# Patient Record
Sex: Male | Born: 2005 | Race: White | Hispanic: No | Marital: Single | Smoking: Never smoker
Health system: Southern US, Community
[De-identification: ages and names within clinical notes are randomized; demographics above are authoritative.]

## PROBLEM LIST (undated history)

## (undated) ENCOUNTER — Emergency Department (HOSPITAL_COMMUNITY): Discharge status: Absent without leave | Payer: Self-pay

## (undated) DIAGNOSIS — T7840XA Allergy, unspecified, initial encounter: Secondary | ICD-10-CM

## (undated) DIAGNOSIS — J45909 Unspecified asthma, uncomplicated: Secondary | ICD-10-CM

## (undated) DIAGNOSIS — F909 Attention-deficit hyperactivity disorder, unspecified type: Secondary | ICD-10-CM

## (undated) DIAGNOSIS — E7800 Pure hypercholesterolemia, unspecified: Secondary | ICD-10-CM

## (undated) HISTORY — DX: Allergy, unspecified, initial encounter: T78.40XA

## (undated) HISTORY — PX: TYMPANOSTOMY TUBE PLACEMENT: SHX32

## (undated) HISTORY — DX: Attention-deficit hyperactivity disorder, unspecified type: F90.9

## (undated) HISTORY — PX: TOOTH EXTRACTION: SHX859

---
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
## 2024-03-27 ENCOUNTER — Encounter (INDEPENDENT_AMBULATORY_CARE_PROVIDER_SITE_OTHER): Payer: Self-pay | Admitting: Pediatrics

## 2024-03-27 ENCOUNTER — Ambulatory Visit (INDEPENDENT_AMBULATORY_CARE_PROVIDER_SITE_OTHER): Payer: Self-pay | Admitting: Pediatrics

## 2024-03-27 VITALS — BP 110/76 | HR 89 | Wt 210.8 lb

## 2024-03-27 DIAGNOSIS — E7849 Other hyperlipidemia: Secondary | ICD-10-CM | POA: Diagnosis not present

## 2024-03-27 DIAGNOSIS — Z713 Dietary counseling and surveillance: Secondary | ICD-10-CM | POA: Diagnosis not present

## 2024-03-27 DIAGNOSIS — Z8342 Family history of familial hypercholesterolemia: Secondary | ICD-10-CM | POA: Diagnosis not present

## 2024-03-27 DIAGNOSIS — Z7187 Encounter for pediatric-to-adult transition counseling: Secondary | ICD-10-CM

## 2024-03-27 LAB — LIPID PANEL
Cholesterol: 275 mg/dL — ABNORMAL HIGH (ref <170)
HDL: 51 mg/dL (ref >45)
LDL Cholesterol (Calc): 187 mg/dL — ABNORMAL HIGH (ref <110)
Non-HDL Cholesterol (Calc): 224 mg/dL — ABNORMAL HIGH (ref <120)
Total CHOL/HDL Ratio: 5.4 (calc) — ABNORMAL HIGH (ref <5.0)
Triglycerides: 194 mg/dL — ABNORMAL HIGH (ref <90)

## 2024-03-27 MED ORDER — ROSUVASTATIN CALCIUM 10 MG PO TABS: 10.0000 mg | ORAL_TABLET | Freq: Every day | ORAL | 3 refills | Status: AC

## 2024-03-27 NOTE — Patient Instructions (Addendum)
 Medication: continue rosuvastatin  10mg  daily  Laboratory studies:  Recommend follow up with family practice for further management.  Recommendations for healthy eating  Never skip breakfast. Try to have at least 10 grams of protein (glass of milk, eggs, shake, or breakfast bar). No soda, juice, or sweetened drinks. Limit starches/carbohydrates to 1 fist per meal at breakfast, lunch and dinner. No eating after dinner. Eat three meals per day and dinner should be with the family. Limit of one snack daily, after school. All snacks should be a fruit or vegetables without dressing. Avoid bananas/grapes. Low carb fruits: berries, green apple, cantaloupe, honeydew No breaded or fried foods. Increase water intake, drink ice cold water 8 to 10 ounces before eating. Exercise daily for 30 to 60 minutes.  For insomnia or inability to stay asleep at night: Sleep App: Insomnia Coach  Meditate: Headspace on Netflix has guided meditation or Youtube Apps: Calm or Headspace have guided meditation     Education: What is hypercholesterolemia?  Hypercholesterolemia in a child means elevated blood cholesterol levels. Hyperlipidemia is a more general term that refers to elevation of cholesterol or triglyceride level. Total cholesterol is made up of non-high-density lipoprotein (HDL) cholesterol (non-HDL-C) and HDL cholesterol (HDL-C). Non-HDL-C is made up of low-density lipoprotein (LDL) cholesterol (LDL-C) and very low-density lipoprotein (VLDL) cholesterol (VLDL-C). If triglyceride levels are equal to or greater than 400 mg/dL, the calculated  LDL-C is not accurate and a direct measurement must be ordered.  Low-density lipoprotein cholesterol is referred to as the bad cholesterol that gets deposited in blood vessel walls. High-density lipoprotein cholesterol is referred to as the good cholesterol that removes excess LDL-C from blood vessel walls. Even though elevated LDL-C and HDL-C levels can cause  hypercholesterolemia, an elevated LDL-C level traditionally carries more risk.  An acceptable LDL-C level in otherwise healthy children is less than 110 mg/dL; borderline high is 098 to 129 mg/dL; and high is 119 mg/dL or greater, according to [facility], [facility], [facility] Institute guidelines. Other cardiovascular risk factors and whether a child has diabetes should be taken into consideration when deciding whether to treat an LDL-C level above 130 mg/dL.  What causes hypercholesterolemia?  Low-density lipoprotein cholesterol is mainly made by the liver; some LDL-C comes from the diet. The blood level of LDL-C is also influenced by hereditary factors. Mild to moderate LDL-C level elevation can be the result of bad diet and lifestyle. Mild to moderate LDL-C level elevation can also be seen in obesity. In children with obesity, triglyceride levels are often elevated more than cholesterol, and HDL-C level is often low.   Familial hypercholesterolemia is a common inherited condition. Children with this condition often have an LDL-C level that is greater than 190 mg/dL. In this condition, the liver keeps making LDL-C even though the blood level of LDL-C is already high. Uncontrolled diabetes, hypothyroidism, and kidney diseases can also cause elevated LDL-C and triglyceride levels.  What problems result from having high cholesterol levels?  Extra cholesterol, especially LDL-C, is deposited in blood vessel walls, the buildup of which causes an inflammatory reaction. Over time, this can narrow the arteries, block blood flow, and cause heart attacks. Such problems usually do not occur before 19 years of age in men and 19 years of age in women. Patients with LDL-C levels above 190 mg/dL are at risk for having these problems at an earlier age. What are the measures to control high cholesterol?  Dietary cholesterol mainly comes from foods of animal origin, but  it does not play a large role in determining  cholesterol levels. Increasing physical activity in children with elevated cholesterol levels can be beneficial to improve cholesterol levels. The American Academy of Pediatrics recommends that children get at least 1 hour of moderate to vigorous activity daily. A diet restricting fat can decrease LDL-C levels by 8% to 10%.Total fat restriction is not as important as restricting saturated and trans fats while favoring healthy monounsaturated and polyunsaturated fats.  Saturated fats can increase blood cholesterol levels. Foods high in saturated fat mostly include animal products, such as meat (eg, beef, sausage, bacon, hot dogs, lunch meats like bologna, salami, and poultry with skin), and dairy products (eg, whole milk, cheese, butter, ice cream). Choose lean protein foods, like fish, lean cuts of meat, white meat without skin, and lean cuts of red meat, to reduce saturated fat. Low-fat (2%) milk can be used starting at 33 to 19 years of age in those with risk factors for hypercholesterolemia.  Fat-free or 1% milk can be used after 19 years of age. Remember to look at total fat and saturated fat contents on nutrition labels.   In children with hypercholesterolemia, saturated fat intake should be less than 7% of calories and dietary cholesterol intake should be less than 200 mg per day. Plant oils that are high in saturated fats include coconut and palm oils. Eating trans fats found in highly processed foods can also increase LDL-C levels; hence avoid trans fat as much as possible. Some examples of foods that contain trans fatty acids are microwave popcorn, frozen pizza, and coffee creamer. Cooking should be done using vegetable oils (eg, canola, corn, olive, safflower) that are high in monounsaturated and polyunsaturated fats. Try to increase your child's intake of nuts, such  as walnuts and almonds, which are another source of monounsaturated fats. Cold-water fish, especially tuna, swordfish, salmon, mackerel,  sardines, and herring, are healthy, and eating them at least 2 times a week is recommended as a source of omega-3 polyunsaturated fats.  Cholesterol is never found in plant sources. In fact, plant sterols (cholesterol equivalent of plants) can decrease LDL-C levels. Whole grains, beans, high-fiber cereals, fruits, vegetables, water-soluble fiber (eg, psyllium), soy protein, and oat bran can reduce LDL-C levels by decreasing absorption of cholesterol. It is recommended that children with obesity, elevated LDL-C levels, and elevated triglyceride levels work on weight loss and reduce their intake of sugars and starchy foods (see Hypertriglyceridemia: A Guide for Families handout).  Medications  When LDL-C levels are persistently higher than 130 to 190 mg/dL, depending on the presence or absence of specific risk factors  and/or conditions, even after dietary and lifestyle changes, your child's doctor may recommend a lipid-lowering medication, such as a statin. Thus, children with medical conditions, such as diabetes or kidney disease, may need to start cholesterol-lowering medications at lower levels of LDL-C than otherwise healthy children.   When should your child's cholesterol levels be checked?  Cholesterol screening is recommended for all children between ages 25 and 11 years and again between 49 and 21 years. If there is positive family history of heart disease and elevated cholesterol in immediate relatives, cholesterol screening is recommended earlier, between 48 and 79 years of age.   Pediatric Endocrinology Fact Sheet What is Hypercholesterolemia? A Guide for Families Copyright  2018 American Academy of Pediatrics and Pediatric Endocrine Society. All rights reserved. The information contained in this publication should not be used as a substitute for the medical care and advice of  your pediatrician. There may be variations in treatment that your pediatrician may recommend based on individual  facts and circumstances. Pediatric Endocrine Society/American Academy of Pediatrics  Section on Endocrinology Patient Education Committee

## 2024-03-27 NOTE — Progress Notes (Signed)
 Pediatric Endocrinology Consultation Follow-up Visit [place] Marchiano Mar 08, 2006 [id number] Alejandro Hurt, FNP   HPI: Darrell Flores  is a 18 y.o. male presenting for follow-up of Hypercholesterolemia.  he is accompanied to this visit by his father and family. Interpreter present throughout the visit: No.  Kincade was last seen at [facility] on 05/30/2023.  Since last visit, he has been taking rosuvastatin , 1 tablet daily with no side effects.  ROS: Greater than 10 systems reviewed with pertinent positives listed in HPI, otherwise neg. The following portions of the patient's history were reviewed and updated as appropriate:  Past Medical History:  has a past medical history of ADHD (attention deficit hyperactivity disorder), Allergy, Asthma, and High cholesterol.  Meds: Current Outpatient Medications  Medication Instructions   albuterol  (VENTOLIN ) (5 MG/ML) 0.5% NEBU As needed   EPINEPHrine  0.3 mg/0.3 mL IJ SOAJ injection Inject into the muscle.   methylphenidate (CONCERTA) 27 mg, Daily at bedtime   rosuvastatin  (CRESTOR ) 10 mg, Oral, Daily    Allergies: Allergies  Allergen Reactions   Peanuts [Peanut Oil]     Surgical History: Past Surgical History:  Procedure Laterality Date   TOOTH EXTRACTION     pulled baby tooth, exposed anopther to be pulled   TYMPANOSTOMY TUBE PLACEMENT      Family History: family history includes Hypercholesterolemia in his father; Hypertension in his father. He was adopted.  Social History: Social History   Social History Narrative   Per father they were able to contact birth family and there is a strong family history of high lipids, a 71 year old bio cousin is being treated for this. Bio uncle had heart attack in his 30's.          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   Lives with Mom, dad and brother     reports that he has never smoked. He has never been exposed to tobacco smoke. He has never used smokeless tobacco. He reports that he does  not drink alcohol and does not use drugs.  Physical Exam:  Vitals:   03/27/24 1100  BP: 110/76  Pulse: 89  Weight: 210 lb 12.8 oz (95.6 kg)   BP 110/76 (BP Location: Left Arm, Patient Position: Sitting, Cuff Size: Normal)   Pulse 89   Wt 210 lb 12.8 oz (95.6 kg)  Body mass index: body mass index is unknown because there is no height or weight on file. Blood pressure %iles are not available for patients who are 18 years or older. No height and weight on file for this encounter.  Wt Readings from Last 3 Encounters:  03/27/24 210 lb 12.8 oz (95.6 kg) (96%, Z= 1.74)*  05/30/23 (!) 226 lb (102.5 kg) (98%, Z= 2.11)*  08/29/22 193 lb 9.6 oz (87.8 kg) (94%, Z= 1.57)*   * Growth percentiles are based on CDC (Boys, 2-20 Years) data.   Ht Readings from Last 3 Encounters:  05/30/23 5' 5.75 (1.67 m) (10%, Z= -1.26)*  08/29/22 5' 5.16 (1.655 m) (9%, Z= -1.35)*  05/05/22 5' 5.24 (1.657 m) (10%, Z= -1.26)*   * Growth percentiles are based on CDC (Boys, 2-20 Years) data.   Physical Exam  Skin:    Comments: No xanthomas      Labs: Results for orders placed or performed in visit on 05/30/23  Lipid panel   Collection Time: 06/13/23  8:52 AM  Result Value Ref Range   Cholesterol 204 (H) <170 mg/dL   HDL 69 >57 mg/dL   Triglycerides 846 (  H) <90 mg/dL   LDL Cholesterol (Calc) 103 <110 mg/dL (calc)   Total CHOL/HDL Ratio 3.0 <5.0 (calc)   Non-HDL Cholesterol (Calc) 135 (H) <120 mg/dL (calc)    Imaging: No results found for this or any previous visit.   Assessment/Plan: Daimion was seen today for hyperlipidemia, familial, high ldl.  Familial hyperlipidemia -     Lipid panel    There are no Patient Instructions on file for this visit.  Follow-up:   No follow-ups on file.  Medical decision-making:  I have personally spent *** minutes involved in face-to-face and non-face-to-face activities for this patient on the day of the visit. Professional time spent includes the following  activities, in addition to those noted in the documentation: preparation time/chart review, ordering of medications/tests/procedures, obtaining and/or reviewing separately obtained history, counseling and educating the patient/family/caregiver, performing a medically appropriate examination and/or evaluation, referring and communicating with other health care professionals for care coordination, my interpretation of the bone age***, and documentation in the EHR.  Thank you for the opportunity to participate in the care of your patient. Please do not hesitate to contact me should you have any questions regarding the assessment or treatment plan.   Sincerely,   Maryjo Snipe, MD

## 2024-03-28 NOTE — Assessment & Plan Note (Addendum)
-  Fasting Lipid panel obtained after the visit showed rising total cholesterol, LDL rising with elevated, but slightly improved Triglycerides. LVM for the family to call the office to discuss. -Continue Rosuvastatin  10mg  daily and restart Zetia  10mg  daily.  -Repeat lipid panel in 3-4 months and follow up with me or family practice. Awaiting FNP to assume care of his hyperlipidemia to transition care since he is an adult now.

## 2024-03-29 ENCOUNTER — Ambulatory Visit (INDEPENDENT_AMBULATORY_CARE_PROVIDER_SITE_OTHER): Payer: Self-pay | Admitting: Pediatrics

## 2024-03-29 ENCOUNTER — Telehealth (INDEPENDENT_AMBULATORY_CARE_PROVIDER_SITE_OTHER): Payer: Self-pay | Admitting: Pediatrics

## 2024-03-29 DIAGNOSIS — Z7187 Encounter for pediatric-to-adult transition counseling: Secondary | ICD-10-CM | POA: Insufficient documentation

## 2024-03-29 MED ORDER — EZETIMIBE 10 MG PO TABS: 10.0000 mg | ORAL_TABLET | Freq: Every day | ORAL | 5 refills | Status: AC

## 2024-03-29 NOTE — Telephone Encounter (Signed)
 Darrell Flores's mom called and stated that provider has left both her and his dad voicemail's stating she has lab results but because [name] is now 57 we would need an updated form on file giving us  permission to speak with to his parents. Mom left a good callback number for Darrell Flores that Dr. Ames Bakes can reach him on with those results. [phone]

## 2024-03-29 NOTE — Telephone Encounter (Signed)
 He will be in college in 3-4 months, so will need to find a local adult doctor to assume care. He acknowledged this.   Transitioned to adult care.  Maryjo Snipe, MD 03/29/2024

## 2024-03-29 NOTE — Progress Notes (Signed)
@  [facility]: Left HIP[employer] voicemail. Please discuss the following when they call back @admin  pool: Please schedule FU appointment in 3-4 months -Fasting Lipid panel obtained after the visit showed rising total cholesterol, LDL rising with elevated, but slightly improved Triglycerides. LVM for the family to call the office to discuss. -Continue Rosuvastatin  10mg  daily and restart Zetia  10mg  daily.  -Repeat lipid panel in 3-4 months and follow up
